# Patient Record
Sex: Male | Born: 1965 | Race: White | Hispanic: No | Marital: Single
Health system: Southern US, Community
[De-identification: ages and names within clinical notes are randomized; demographics above are authoritative.]

## PROBLEM LIST (undated history)

## (undated) DIAGNOSIS — M109 Gout, unspecified: Secondary | ICD-10-CM

---

## 2002-04-26 ENCOUNTER — Emergency Department (HOSPITAL_COMMUNITY): Admission: EM | Admit: 2002-04-26 | Discharge: 2002-04-26 | Payer: Self-pay | Admitting: Emergency Medicine

## 2003-10-31 ENCOUNTER — Emergency Department (HOSPITAL_COMMUNITY): Admission: EM | Admit: 2003-10-31 | Discharge: 2003-10-31 | Payer: Self-pay

## 2006-06-20 ENCOUNTER — Emergency Department (HOSPITAL_COMMUNITY): Admission: EM | Admit: 2006-06-20 | Discharge: 2006-06-20 | Payer: Self-pay | Admitting: Emergency Medicine

## 2008-03-18 ENCOUNTER — Emergency Department (HOSPITAL_COMMUNITY): Admission: EM | Admit: 2008-03-18 | Discharge: 2008-03-18 | Payer: Self-pay | Admitting: Emergency Medicine

## 2011-04-10 LAB — POCT CARDIAC MARKERS
CKMB, poc: 1.1
CKMB, poc: 1.3
CKMB, poc: 1.4
Myoglobin, poc: 66.3
Myoglobin, poc: 68.1
Myoglobin, poc: 72.5
Troponin i, poc: <0.05
Troponin i, poc: <0.05
Troponin i, poc: <0.05

## 2012-04-15 ENCOUNTER — Encounter (HOSPITAL_COMMUNITY): Payer: Self-pay | Admitting: Emergency Medicine

## 2012-04-15 ENCOUNTER — Emergency Department (HOSPITAL_COMMUNITY)
Admission: EM | Admit: 2012-04-15 | Discharge: 2012-04-15 | Disposition: A | Discharge status: Home / Self Care | Payer: Self-pay | Attending: Emergency Medicine | Admitting: Emergency Medicine

## 2012-04-15 DIAGNOSIS — M109 Gout, unspecified: Secondary | ICD-10-CM | POA: Insufficient documentation

## 2012-04-15 HISTORY — DX: Gout, unspecified: M10.9

## 2012-04-15 MED ORDER — PREDNISONE 10 MG PO TABS: 20.0000 mg | ORAL_TABLET | Freq: Every day | ORAL | Status: DC

## 2012-04-15 MED ORDER — OXYCODONE-ACETAMINOPHEN 5-325 MG PO TABS: 1.0000 | ORAL_TABLET | Freq: Four times a day (QID) | ORAL | Status: DC | PRN

## 2012-04-15 MED ORDER — ALLOPURINOL 100 MG PO TABS: 100.0000 mg | ORAL_TABLET | Freq: Every day | ORAL | Status: DC

## 2012-04-15 NOTE — ED Notes (Signed)
Pt c/o gout pain in left big toe/foot.  Pt given Indomethacin and Uloric 1 month ago which he states has not helped.

## 2012-04-15 NOTE — ED Provider Notes (Signed)
History     CSN: [id number]  Arrival date & time 04/15/12  1017   First MD Initiated Contact with Patient 04/15/12 1116      Chief Complaint  Patient presents with  . Foot Pain  . Gout    (Consider location/radiation/quality/duration/timing/severity/associated sxs/prior treatment) HPI  46 year old male with history of gout presenting complaining of left big toe pain. Patient reports he was diagnosed with gout 3 years ago. He has had a fairly benign course of symptoms until about a month ago when he has gradual onset of pain to his left great toe. Describe pain as a sharp and stabbing sensation, constant,  worsening with palpation or with movement. Does notice redness and swelling to the surrounding skin. Pain felt similar to previous gouty flare. He did follow up at urgent care several weeks ago and was prescribed uric and indomethacin. Patient states he has been taking medication with minimal relief. 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 Denies fever, chills, or rash. He has been avoiding red meat, and beer.    Past Medical History  Diagnosis Date  . Gout     History reviewed. No pertinent past surgical history.  History reviewed. No pertinent family history.  History  Substance Use Topics  . Smoking status: Never Smoker   . Smokeless tobacco: Not on file  . Alcohol Use: No      Review of Systems  Constitutional: Negative for fever.  Musculoskeletal: Positive for joint swelling.  Skin: Negative for rash and wound.    Allergies  Codeine  Home Medications   Current Outpatient Rx  Name Route Sig Dispense Refill  . FEBUXOSTAT 40 MG PO TABS Oral Take 80 mg by mouth daily.    . INDOMETHACIN 50 MG PO CAPS Oral Take 50 mg by mouth 3 (three) times daily with meals.      BP 145/90  Pulse 71  Temp 97.5 F (36.4 C) (Oral)  Resp 18  Ht 5\' 9"  (1.753 m)  Wt 183 lb (83.008 kg)   BMI 27.02 kg/m2  SpO2 99%  Physical Exam  Nursing note and vitals reviewed. Constitutional: He appears well-developed and well-nourished. No distress.  HENT:  Head: Atraumatic.  Eyes: Conjunctivae normal are normal.  Neck: Neck supple.  Musculoskeletal: He exhibits tenderness (L great toe with moderate tenderness, and swelling noted to MCP.  surrounding skin with warmth but without rash. ).       Pedal pulses intact  Neurological: He is alert.  Skin: Skin is warm. No rash noted.  Psychiatric: He has a normal mood and affect.    ED Course  Procedures (including critical care time)  Labs Reviewed - No data to display No results found.   No diagnosis found.  1. gout  MDM  Pain to L great toe x 1 month consistent with gouty flare.  Will d/c pt with percocet, prednisone taper course, and allopurinol.  Recommend f/u with pcp.  Doubt septic arthritis consider long duration.  Doubt trauma.  Pt voice understanding and agrees with plan.  Imaging considered, pt declined.  Pt unsure of allergy to codeine.  Agrees to take it.  Precaution discussed  BP 145/90  Pulse 71  Temp 97.5 F (36.4 C) (Oral)  Resp 18  Ht 5\' 9"  (1.753 m)  Wt 183 lb (83.008 kg)  BMI 27.02 kg/m2  SpO2 99%  Fayrene Helper, PA-C 04/15/12 1155

## 2012-04-16 NOTE — ED Provider Notes (Signed)
Medical screening examination/treatment/procedure(s) were performed by non-physician practitioner and as supervising physician I was immediately available for consultation/collaboration.   Loren Racer, MD 04/16/12 [phone]

## 2013-05-09 ENCOUNTER — Emergency Department (HOSPITAL_COMMUNITY): Payer: Self-pay

## 2013-05-09 ENCOUNTER — Emergency Department (HOSPITAL_COMMUNITY)
Admission: EM | Admit: 2013-05-09 | Discharge: 2013-05-09 | Disposition: A | Discharge status: Home / Self Care | Payer: Self-pay | Attending: Emergency Medicine | Admitting: Emergency Medicine

## 2013-05-09 ENCOUNTER — Encounter (HOSPITAL_COMMUNITY): Payer: Self-pay | Admitting: Emergency Medicine

## 2013-05-09 DIAGNOSIS — Z862 Personal history of diseases of the blood and blood-forming organs and certain disorders involving the immune mechanism: Secondary | ICD-10-CM | POA: Insufficient documentation

## 2013-05-09 DIAGNOSIS — J189 Pneumonia, unspecified organism: Secondary | ICD-10-CM | POA: Insufficient documentation

## 2013-05-09 DIAGNOSIS — Z8639 Personal history of other endocrine, nutritional and metabolic disease: Secondary | ICD-10-CM | POA: Insufficient documentation

## 2013-05-09 MED ORDER — AZITHROMYCIN 250 MG PO TABS: 250.0000 mg | ORAL_TABLET | Freq: Every day | ORAL | Status: DC

## 2013-05-09 MED ORDER — ACETAMINOPHEN-CODEINE 120-12 MG/5ML PO SUSP: 5.0000 mL | Freq: Four times a day (QID) | ORAL | Status: DC | PRN

## 2013-05-09 NOTE — ED Provider Notes (Signed)
CSN: [id number]     Arrival date & time 05/09/13  0915 History   First MD Initiated Contact with Patient 05/09/13 [phone]     Chief Complaint  Patient presents with  . Cough   (Consider location/radiation/quality/duration/timing/severity/associated sxs/prior Treatment) HPI Pt is a 47yo male with hx of gout c/o 1 week hx of persistent productive cough with yellow sputum x1 week.  Pt was seen at Urgent Care on Wednesday, 10/29 and dx with bronchitis. He was Rx: bactrim, benzonatate and promethazine for his persistent cough.  Pt states cough is worse when he wakes up in the morning, gradually improves throughout the day and then worse again at night preventing him from sleeping well. Has tried "everything" over the counter without relief. 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  Denies sick contacts at work or home. Denies fever, n/v/d. Denies hx of asthma or smoking.  Past Medical History  Diagnosis Date  . Gout    History reviewed. No pertinent past surgical history. No family history on file. History  Substance Use Topics  . Smoking status: Never Smoker   . Smokeless tobacco: Not on file  . Alcohol Use: No    Review of Systems  Constitutional: Positive for chills and fatigue. Negative for fever, appetite change and unexpected weight change.  HENT: Positive for congestion and sore throat. Negative for voice change.   Respiratory: Positive for cough. Negative for chest tightness, shortness of breath, wheezing and stridor.   Cardiovascular: Negative for chest pain.  Gastrointestinal: Negative for nausea, vomiting, abdominal pain and diarrhea.  All other systems reviewed and are negative.    Allergies  Codeine  Home Medications   Current Outpatient Rx  Name  Route  Sig  Dispense  Refill  . benzonatate (TESSALON) 100 MG capsule   Oral   Take 100 mg by mouth every 6 (six) hours as needed for cough.         . promethazine-dextromethorphan (PROMETHAZINE-DM) 6.25-15 MG/5ML syrup   Oral   Take 5 mLs by mouth every 6 (six) hours as needed for cough.         Marland Kitchen acetaminophen-codeine 120-12 MG/5ML suspension   Oral   Take 5 mLs by mouth every 6 (six) hours as needed for pain.   60 mL   0   . azithromycin (ZITHROMAX) 250 MG tablet   Oral   Take 1 tablet (250 mg total) by mouth daily. Take first 2 tablets together, then 1 every day until finished.   6 tablet   0    BP 120/83  Pulse 99  Temp(Src) 98.6 F (37 C) (Oral)  Resp 16  SpO2 96% Physical Exam  Nursing note and vitals reviewed. Constitutional: He appears well-developed and well-nourished.  HENT:  Head: Normocephalic and atraumatic.  Right Ear: Hearing, tympanic membrane, external ear and ear canal normal.  Left Ear: Hearing, tympanic membrane, external ear and ear canal normal.  Nose: Mucosal edema present.  Mouth/Throat: Uvula is midline and mucous membranes are normal. Posterior oropharyngeal erythema present. No oropharyngeal exudate, posterior oropharyngeal edema or tonsillar abscesses.  Eyes: Conjunctivae are normal. No scleral icterus.  Neck: Normal range of motion.  Cardiovascular: Normal rate, regular rhythm and normal heart sounds.   Pulmonary/Chest: Effort normal and breath sounds normal. No respiratory distress. He has no wheezes.  He has no rales. He exhibits no tenderness.  Intermittent mix dry and productive cough. No respiratory distress, able to speak in full sentences w/o difficulty. Lungs: CTAB  Abdominal: Soft. Bowel sounds are normal. He exhibits no distension and no mass. There is no tenderness. There is no rebound and no guarding.  Musculoskeletal: Normal range of motion.  Neurological: He is alert.  Skin: Skin is warm and dry.    ED Course  Procedures (including critical care time) Labs Review Labs Reviewed - No data to  display Imaging Review Dg Chest 2 View  05/09/2013   CLINICAL DATA:  Initial encounter for 1 week history of cough and chest congestion.  EXAM: CHEST  2 VIEW  COMPARISON:  06/20/2006.  FINDINGS: Streaky and patchy opacities in the right lower lobe, new since the prior examination. Lungs otherwise clear. No localized airspace consolidation. No pleural effusions. No pneumothorax. Normal pulmonary vascularity. Cardiomediastinal silhouette unremarkable and unchanged. Visualized bony thorax intact.  IMPRESSION: Mild atelectasis and/or bronchopneumonia in the right lower lobe.   Electronically Signed   By: Hulan Saas M.D.   On: 05/09/2013 11:09    EKG Interpretation   None       MDM   1. Right lower lobe pneumonia    Pt c/o persistent productive cough that has kept him up at night for the last several nights. Denies any relief from OTC medications, and medication rx from him by urgent care. Vitals: unremarkable. Pt appears non-toxic. No respiratory distress.  CXR: mild atelectasis and/or bronchopneumonia ir right lower lobe.  Will tx for pneumonia. Discussed discontinuing bactrim and starting pt on azithromycin.  Rx: azithromycin and acetaminophen-codeine syrup.  Discussed use of benadryl if pt develops itching with codeine. Advised not to drive, work, or operate heavy machinery while taking codeine.    All labs/imaging/findings discussed with patient. All questions answered and concerns addressed. Will discharge pt home and have pt f/u with [facility] info provided. Return precautions given. Pt verbalized understanding and agreement with tx plan. Vitals: unremarkable. Discharged in stable condition.    Discussed pt with attending during ED encounter and agrees with plan.     Junius Finner, PA-C 05/09/13 1133

## 2013-05-09 NOTE — ED Notes (Signed)
Pt reports having cough with productive, yellow sputum x1 week. Pt went to Urgent Care Wednesday and was dx with bronchitis. Pt was given abx, cough pills, syrup and has been taking them with no relief. Pt is A&O and in NAD.Pt c/o not being able to sleep.

## 2013-05-10 NOTE — ED Provider Notes (Signed)
Medical screening examination/treatment/procedure(s) were performed by non-physician practitioner and as supervising physician I was immediately available for consultation/collaboration.  EKG Interpretation   None         Monti Villers T Jarryn Altland, MD 05/10/13 0745 

## 2013-05-13 ENCOUNTER — Encounter (HOSPITAL_COMMUNITY): Payer: Self-pay | Admitting: Emergency Medicine

## 2013-05-13 ENCOUNTER — Emergency Department (HOSPITAL_COMMUNITY): Payer: Self-pay

## 2013-05-13 ENCOUNTER — Emergency Department (HOSPITAL_COMMUNITY)
Admission: EM | Admit: 2013-05-13 | Discharge: 2013-05-13 | Disposition: A | Discharge status: Home / Self Care | Payer: Self-pay | Attending: Emergency Medicine | Admitting: Emergency Medicine

## 2013-05-13 DIAGNOSIS — Z862 Personal history of diseases of the blood and blood-forming organs and certain disorders involving the immune mechanism: Secondary | ICD-10-CM | POA: Insufficient documentation

## 2013-05-13 DIAGNOSIS — R059 Cough, unspecified: Secondary | ICD-10-CM | POA: Insufficient documentation

## 2013-05-13 DIAGNOSIS — R05 Cough: Secondary | ICD-10-CM

## 2013-05-13 DIAGNOSIS — Z8639 Personal history of other endocrine, nutritional and metabolic disease: Secondary | ICD-10-CM | POA: Insufficient documentation

## 2013-05-13 DIAGNOSIS — Z8701 Personal history of pneumonia (recurrent): Secondary | ICD-10-CM | POA: Insufficient documentation

## 2013-05-13 MED ORDER — ACETAMINOPHEN-CODEINE 120-12 MG/5ML PO SUSP: 5.0000 mL | Freq: Four times a day (QID) | ORAL | Status: DC | PRN

## 2013-05-13 NOTE — ED Notes (Signed)
Patient complains of cough with yellow sputum. Patient went to urgent care last Thursday. Patient states Dx as "bronchitis" from urgent care. Patient came to [facility] ED Sunday morning and left with DX of pneumonia.

## 2013-05-13 NOTE — ED Provider Notes (Signed)
CSN: [id number]     Arrival date & time 05/13/13  1549 History   First MD Initiated Contact with Patient 05/13/13 1607     Chief Complaint  Patient presents with  . Cough   (Consider location/radiation/quality/duration/timing/severity/associated sxs/prior Treatment) HPI Comments: Patient presents to the emergency department with chief complaint of cough. States that he's been coughing for the past week or 2. Was recently diagnosed with pneumonia. He initially Z-Pak today. He states that he has been improving, but still has been coughing a lot. He states that he did not want to go to work today because of the severity of his cough. He is requesting a work note. He also wants to make certain the pneumonia is clearing up. He denies recent fever, or chills. Denies any other symptoms. States that he is tried taking Robitussin with codeine which has helped suppress the cough.  The history is provided by the patient. No language interpreter was used.    Past Medical History  Diagnosis Date  . Gout    No past surgical history on file. No family history on file. History  Substance Use Topics  . Smoking status: Never Smoker   . Smokeless tobacco: Not on file  . Alcohol Use: No    Review of Systems  All other systems reviewed and are negative.    Allergies  Codeine  Home Medications   Current Outpatient Rx  Name  Route  Sig  Dispense  Refill  . acetaminophen-codeine 120-12 MG/5ML suspension   Oral   Take 5 mLs by mouth every 6 (six) hours as needed for pain.   60 mL   0   . azithromycin (ZITHROMAX) 250 MG tablet   Oral   Take 1 tablet (250 mg total) by mouth daily. Take first 2 tablets together, then 1 every day until finished.   6 tablet   0   . benzonatate (TESSALON) 100 MG capsule   Oral   Take 100 mg by mouth every 6 (six) hours as needed for cough.         . promethazine-dextromethorphan (PROMETHAZINE-DM) 6.25-15 MG/5ML syrup   Oral   Take 5 mLs by mouth every 6  (six) hours as needed for cough.          BP 142/85  Pulse 119  Temp(Src) 98.4 F (36.9 C) (Oral)  Resp 18  SpO2 97% Physical Exam  Nursing note and vitals reviewed. Constitutional: He is oriented to person, place, and time. He appears well-developed and well-nourished.  HENT:  Head: Normocephalic and atraumatic.  Right Ear: External ear normal.  Left Ear: External ear normal.  Nose: Nose normal.  Mouth/Throat: Oropharynx is clear and moist. No oropharyngeal exudate.  Eyes: Conjunctivae and EOM are normal. Pupils are equal, round, and reactive to light. Right eye exhibits no discharge. Left eye exhibits no discharge. No scleral icterus.  Neck: Normal range of motion. Neck supple. No JVD present.  Cardiovascular: Normal rate, regular rhythm, normal heart sounds and intact distal pulses.  Exam reveals no gallop and no friction rub.   No murmur heard. Pulmonary/Chest: Effort normal and breath sounds normal. No respiratory distress. He has no wheezes. He has no rales. He exhibits no tenderness.  Abdominal: Soft. Bowel sounds are normal. He exhibits no distension and no mass. There is no tenderness. There is no rebound and no guarding.  Musculoskeletal: Normal range of motion. He exhibits no edema and no tenderness.  Neurological: He is alert and oriented to person, place,  and time.  Skin: Skin is warm and dry.  Psychiatric: He has a normal mood and affect. His behavior is normal. Judgment and thought content normal.    ED Course  Procedures (including critical care time) Results for orders placed during the hospital encounter of 03/18/08  POCT CARDIAC MARKERS      Result Value Range   Myoglobin, poc 72.5     CKMB, poc 1.3     Troponin i, poc <0.05    POCT CARDIAC MARKERS      Result Value Range   Myoglobin, poc 66.3     CKMB, poc 1.4     Troponin i, poc <0.05    POCT CARDIAC MARKERS      Result Value Range   Myoglobin, poc 68.1     CKMB, poc 1.1     Troponin i, poc <0.05      Dg Chest 2 View  05/13/2013   CLINICAL DATA:  Productive cough with shortness of breath and chest pain. Known pneumonia.  EXAM: CHEST  2 VIEW  COMPARISON:  Two-view chest 05/09/2013.  FINDINGS: The heart size and mediastinal contours are stable. Linear bibasilar pulmonary opacity is are similar. There is no confluent airspace opacity or pleural effusion. There is probably mild linear scarring in the left upper lobe. The osseous structures appear unchanged.  IMPRESSION: Stable linear basilar scarring or atelectasis. No evidence of pneumonia.   Electronically Signed   By: Roxy Horseman M.D.   On: 05/13/2013 16:39   Dg Chest 2 View  05/09/2013   CLINICAL DATA:  Initial encounter for 1 week history of cough and chest congestion.  EXAM: CHEST  2 VIEW  COMPARISON:  06/20/2006.  FINDINGS: Streaky and patchy opacities in the right lower lobe, new since the prior examination. Lungs otherwise clear. No localized airspace consolidation. No pleural effusions. No pneumothorax. Normal pulmonary vascularity. Cardiomediastinal silhouette unremarkable and unchanged. Visualized bony thorax intact.  IMPRESSION: Mild atelectasis and/or bronchopneumonia in the right lower lobe.   Electronically Signed   By: Hulan Saas M.D.   On: 05/09/2013 11:09      EKG Interpretation   None       MDM   1. Cough     Patient recovering from pneumonia. Will recheck chest x-ray, per patient's request. He states that he is improving. Patient work note for today.  Pt CXR negative for acute infiltrate. Patients symptoms are consistent with URI, likely viral etiology. Discussed that antibiotics are not indicated for viral infections. Pt will be discharged with symptomatic treatment.  Verbalizes understanding and is agreeable with plan. Pt is hemodynamically stable & in NAD prior to dc.  Filed Vitals:   05/13/13 1640  BP: 117/66  Pulse: 86  Temp: 98.6 F (37 C)  Resp: [address] [place] 05/13/13  2330

## 2013-05-15 NOTE — ED Provider Notes (Signed)
Medical screening examination/treatment/procedure(s) were performed by non-physician practitioner and as supervising physician I was immediately available for consultation/collaboration.  Flint Melter, MD 05/15/13 [phone]

## 2013-05-30 ENCOUNTER — Emergency Department (HOSPITAL_COMMUNITY)
Admission: EM | Admit: 2013-05-30 | Discharge: 2013-05-30 | Disposition: A | Discharge status: Home / Self Care | Payer: Self-pay | Attending: Emergency Medicine | Admitting: Emergency Medicine

## 2013-05-30 ENCOUNTER — Emergency Department (HOSPITAL_COMMUNITY): Payer: Self-pay

## 2013-05-30 ENCOUNTER — Encounter (HOSPITAL_COMMUNITY): Payer: Self-pay | Admitting: Emergency Medicine

## 2013-05-30 DIAGNOSIS — R000 Tachycardia, unspecified: Secondary | ICD-10-CM | POA: Insufficient documentation

## 2013-05-30 DIAGNOSIS — Z862 Personal history of diseases of the blood and blood-forming organs and certain disorders involving the immune mechanism: Secondary | ICD-10-CM | POA: Insufficient documentation

## 2013-05-30 DIAGNOSIS — Z8639 Personal history of other endocrine, nutritional and metabolic disease: Secondary | ICD-10-CM | POA: Insufficient documentation

## 2013-05-30 DIAGNOSIS — J9801 Acute bronchospasm: Secondary | ICD-10-CM | POA: Insufficient documentation

## 2013-05-30 DIAGNOSIS — R0789 Other chest pain: Secondary | ICD-10-CM

## 2013-05-30 DIAGNOSIS — R071 Chest pain on breathing: Secondary | ICD-10-CM | POA: Insufficient documentation

## 2013-05-30 DIAGNOSIS — Z8701 Personal history of pneumonia (recurrent): Secondary | ICD-10-CM | POA: Insufficient documentation

## 2013-05-30 MED ORDER — HYDROCODONE-ACETAMINOPHEN 5-325 MG PO TABS: 2.0000 | ORAL_TABLET | Freq: Three times a day (TID) | ORAL | Status: DC | PRN

## 2013-05-30 MED ORDER — ALBUTEROL SULFATE HFA 108 (90 BASE) MCG/ACT IN AERS: 2.0000 | INHALATION_SPRAY | RESPIRATORY_TRACT | Status: DC | PRN

## 2013-05-30 MED ORDER — PREDNISONE 20 MG PO TABS: ORAL_TABLET | ORAL | Status: DC

## 2013-05-30 NOTE — ED Notes (Signed)
Pt recently dx with pna and was rechecked after taking antibiotics and was cleared to go back to work. States that he has still been coughing and for 2 the last weeks when he coughs he has severe pain on his L lower side.

## 2013-05-30 NOTE — ED Provider Notes (Signed)
CSN: [id number]     Arrival date & time 05/30/13  1526 History   First MD Initiated Contact with Patient 05/30/13 1546     Chief Complaint  Patient presents with  . Chest Wall Pain    (Consider location/radiation/quality/duration/timing/severity/associated sxs/prior Treatment) HPI This 47 year old coughing for a month, he was diagnosed with pneumonia in the right lower lobe, he is taking 2 rounds of antibiotics, his cough is much less frequent but when he gets coughing spells they're still severe spells come is not short of breath, during his severe coughing spells last few days he started developing sharp stabbing left-sided positional and pleuritic chest pain which is constant 24 hours a last few days exacerbated by coughing and palpation of his torso twisting of his torso with no abdominal pain no syncope no vomiting no bloody stools no bloody urine and no trauma but he does have severe coughing spells for several seconds at a time he will feel lightheaded with presyncope. There is no treatment prior to arrival other than his recent antibiotics. Past Medical History  Diagnosis Date  . Gout    History reviewed. No pertinent past surgical history. History reviewed. No pertinent family history. History  Substance Use Topics  . Smoking status: Never Smoker   . Smokeless tobacco: Not on file  . Alcohol Use: No    Review of Systems 10 Systems reviewed and are negative for acute change except as noted in the HPI. Allergies  Codeine  Home Medications   Current Outpatient Rx  Name  Route  Sig  Dispense  Refill  . albuterol (PROVENTIL HFA;VENTOLIN HFA) 108 (90 BASE) MCG/ACT inhaler   Inhalation   Inhale 2 puffs into the lungs every 2 (two) hours as needed for wheezing or shortness of breath (cough).   1 Inhaler   0   . HYDROcodone-acetaminophen (NORCO) 5-325 MG per tablet   Oral   Take 2 tablets by mouth every 8 (eight) hours as needed.   15 tablet   0   . predniSONE (DELTASONE)  20 MG tablet      3 tabs po day one, then 2 po daily x 4 days   11 tablet   0    BP 131/94  Pulse 83  Temp(Src) 98.6 F (37 C) (Oral)  Resp 20  SpO2 100% Physical Exam  Nursing note and vitals reviewed. Constitutional:  Awake, alert, nontoxic appearance.  HENT:  Head: Atraumatic.  Eyes: Right eye exhibits no discharge. Left eye exhibits no discharge.  Neck: Neck supple.  Cardiovascular: Regular rhythm.   No murmur heard. Tachycardic  Pulmonary/Chest: Effort normal. No respiratory distress. He has wheezes. He has no rales. He exhibits tenderness.  Exactly reproducible left anterolateral lower chest wall tenderness with no rash no crepitus no deformity noted; mild scattered expiratory wheezes with forced exhalation with somewhat prolonged exhalation with nonlabored breathing with no crackles no rhonchi no retractions no excess or muscle usage, speech is normal, pulse oximetry normal on room air at 96%  Abdominal: Soft. There is no tenderness. There is no rebound.  Musculoskeletal: He exhibits no edema and no tenderness.  Baseline ROM, no obvious new focal weakness.  Neurological: He is alert.  Mental status and motor strength appears baseline for patient and situation.  Skin: No rash noted.  Psychiatric: He has a normal mood and affect.    ED Course  Procedures (including critical care time) Labs Review Labs Reviewed - No data to display Imaging Review No results found.  EKG Interpretation    Date/Time:  Sunday May 30 2013 15:49:09 EST Ventricular Rate:  117 PR Interval:  123 QRS Duration: 83 QT Interval:  317 QTC Calculation: 442 R Axis:   73 Text Interpretation:  Sinus tachycardia with irregular rate Compared to previous tracing Rate faster Confirmed by [facility]  MD, Gwenyth Dingee ([id number]) on 05/30/2013 4:09:24 PM            MDM   1. Chest wall pain   2. Bronchospasm    I doubt any other EMC precluding discharge at this time including, but not necessarily  limited to the following:PNA.    Hurman Horn, MD 06/02/13 [phone]

## 2013-11-05 ENCOUNTER — Encounter (HOSPITAL_COMMUNITY): Payer: Self-pay | Admitting: Emergency Medicine

## 2013-11-05 ENCOUNTER — Emergency Department (HOSPITAL_COMMUNITY)
Admission: EM | Admit: 2013-11-05 | Discharge: 2013-11-05 | Disposition: A | Discharge status: Home / Self Care | Payer: Self-pay | Attending: Emergency Medicine | Admitting: Emergency Medicine

## 2013-11-05 ENCOUNTER — Emergency Department (HOSPITAL_COMMUNITY): Payer: Self-pay

## 2013-11-05 DIAGNOSIS — R05 Cough: Secondary | ICD-10-CM

## 2013-11-05 DIAGNOSIS — M109 Gout, unspecified: Secondary | ICD-10-CM | POA: Insufficient documentation

## 2013-11-05 DIAGNOSIS — J3489 Other specified disorders of nose and nasal sinuses: Secondary | ICD-10-CM | POA: Insufficient documentation

## 2013-11-05 DIAGNOSIS — R059 Cough, unspecified: Secondary | ICD-10-CM | POA: Insufficient documentation

## 2013-11-05 DIAGNOSIS — IMO0002 Reserved for concepts with insufficient information to code with codable children: Secondary | ICD-10-CM | POA: Insufficient documentation

## 2013-11-05 DIAGNOSIS — Z79899 Other long term (current) drug therapy: Secondary | ICD-10-CM | POA: Insufficient documentation

## 2013-11-05 MED ORDER — ALBUTEROL SULFATE HFA 108 (90 BASE) MCG/ACT IN AERS: 1.0000 | INHALATION_SPRAY | Freq: Four times a day (QID) | RESPIRATORY_TRACT | Status: AC | PRN

## 2013-11-05 MED ORDER — BENZONATATE 100 MG PO CAPS
100.0000 mg | ORAL_CAPSULE | Freq: Three times a day (TID) | ORAL | Status: AC
Start: 2013-11-05 — End: ?

## 2013-11-05 NOTE — ED Provider Notes (Signed)
CSN: [id number]     Arrival date & time 11/05/13  0804 History   First MD Initiated Contact with Patient 11/05/13 [phone]     Chief Complaint  Patient presents with  . Cough     (Consider location/radiation/quality/duration/timing/severity/associated sxs/prior Treatment) HPI Comments: Patient reports coughing for the past 6 months is productive of clear white mucus. He states he came in today because he is having trouble sleeping because of his cough. Denies any fever. Denies any chest pain. He was treated for pneumonia last November he states he never got better. He denies smoking. 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 Denies any leg pain or leg swelling. No recent travel. No night sweats or weight loss. Good by mouth intake and urine output.  The history is provided by the patient.    Past Medical History  Diagnosis Date  . Gout    History reviewed. No pertinent past surgical history. History reviewed. No pertinent family history. History  Substance Use Topics  . Smoking status: Passive Smoke Exposure - Never Smoker  . Smokeless tobacco: Not on file  . Alcohol Use: No    Review of Systems  Constitutional: Negative for fever, activity change and appetite change.  HENT: Positive for congestion.   Respiratory: Positive for cough. Negative for chest tightness and shortness of breath.   Cardiovascular: Negative for chest pain.  Gastrointestinal: Negative for nausea, vomiting and abdominal pain.  Genitourinary: Negative for dysuria, urgency and hematuria.  Musculoskeletal: Negative for arthralgias and myalgias.  Skin: Negative for rash.  Neurological: Negative for dizziness, weakness and headaches.  A complete 10 system review of systems was obtained and all systems are negative except as noted in the HPI and PMH.      Allergies  Codeine  Home Medications   Prior to Admission medications   Medication Sig Start Date End Date  Taking? Authorizing Provider  albuterol (PROVENTIL HFA;VENTOLIN HFA) 108 (90 BASE) MCG/ACT inhaler Inhale 2 puffs into the lungs every 2 (two) hours as needed for wheezing or shortness of breath (cough). 05/30/13   Hurman HornJohn M Bednar, MD  HYDROcodone-acetaminophen (NORCO) 5-325 MG per tablet Take 2 tablets by mouth every 8 (eight) hours as needed. 05/30/13   Hurman HornJohn M Bednar, MD  predniSONE (DELTASONE) 20 MG tablet 3 tabs po day one, then 2 po daily x 4 days 05/30/13   Hurman HornJohn M Bednar, MD   BP 136/92  Pulse 87  Temp(Src) 97.7 F (36.5 C) (Oral)  Resp 16  SpO2 100% Physical Exam  Constitutional: He is oriented to person, place, and time. He appears well-developed and well-nourished. No distress.  Frequent dry cough  HENT:  Head: Normocephalic and atraumatic.  Mouth/Throat: Oropharynx is clear and moist. No oropharyngeal exudate.  Eyes: Conjunctivae and EOM are normal. Pupils are equal, round, and reactive to light.  Neck: Normal range of motion. Neck supple.  Cardiovascular: Normal rate, regular rhythm and normal heart sounds.   No murmur heard. Pulmonary/Chest: Effort normal and breath sounds normal. No respiratory distress.  Abdominal: Soft. There is no tenderness. There is no rebound and no guarding.  Musculoskeletal: Normal range of motion. He exhibits no edema and no tenderness.  Neurological: He is alert and oriented to person, place, and time. No cranial nerve deficit. He exhibits normal muscle tone. Coordination normal.  Skin: Skin is warm.    ED Course  Procedures (including critical care time) Labs Review Labs Reviewed - No  data to display  Imaging Review Dg Chest 2 View  11/05/2013   CLINICAL DATA:  Cough.  EXAM: CHEST  2 VIEW  COMPARISON:  May 30, 2013.  FINDINGS: The heart size and mediastinal contours are within normal limits. No pneumothorax or pleural effusion is noted. Stable scarring is seen laterally in the left lung apex. No acute pulmonary disease is noted. The  visualized skeletal structures are unremarkable.  IMPRESSION: No acute cardiopulmonary abnormality seen.   Electronically Signed   By: Roque LiasJames  Green M.D.   On: 11/05/2013 08:51     EKG Interpretation   Date/Time:  Friday Nov 05 2013 08:30:29 EDT Ventricular Rate:  83 PR Interval:  140 QRS Duration: 80 QT Interval:  377 QTC Calculation: 443 R Axis:   48 Text Interpretation:  Sinus rhythm No significant change was found  Confirmed by Manus GunningANCOUR  MD, Dayshawn Irizarry ([id number]) on 11/05/2013 8:35:32 AM      MDM   Final diagnoses:  Cough   Cough for 6 months with smoke exposure and dust exposure. No chest pain. No tachycardia or hypoxia. No travel, fever, recent weight loss or night sweats.  Chest x-ray negative. EKG normal sinus rhythm. No hypoxia or tachycardia. Informed patient of negative findings. Relate that he needs to avoid smoke dust exposure. 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 No indication for antibiotics.     Glynn OctaveStephen Chassie Pennix, MD 11/05/13 1010

## 2013-11-05 NOTE — Discharge Instructions (Signed)

## 2013-11-05 NOTE — ED Notes (Signed)
Pt reports coughing x 6 months with white, thick mucous. Denies fever, sts never smoked but exposed to second hand smoke

## 2013-11-05 NOTE — Progress Notes (Signed)
[facility] provided pt with a list of primary care resources and a [facility] [employer] Card application to help patient establish primary care.  °

## 2018-03-16 ENCOUNTER — Encounter (HOSPITAL_COMMUNITY): Payer: Self-pay

## 2018-03-16 ENCOUNTER — Emergency Department (HOSPITAL_COMMUNITY): Payer: Self-pay

## 2018-03-16 ENCOUNTER — Other Ambulatory Visit: Payer: Self-pay

## 2018-03-16 ENCOUNTER — Emergency Department (HOSPITAL_COMMUNITY)
Admission: EM | Admit: 2018-03-16 | Discharge: 2018-03-16 | Disposition: A | Discharge status: Home / Self Care | Payer: Self-pay | Attending: Emergency Medicine | Admitting: Emergency Medicine

## 2018-03-16 DIAGNOSIS — K5792 Diverticulitis of intestine, part unspecified, without perforation or abscess without bleeding: Secondary | ICD-10-CM | POA: Insufficient documentation

## 2018-03-16 LAB — COMPREHENSIVE METABOLIC PANEL
ALBUMIN: 4.2 g/dL (ref 3.5–5.0)
ALK PHOS: 86 U/L (ref 38–126)
ALT: 38 U/L (ref 0–44)
ANION GAP: 11 (ref 5–15)
AST: 25 U/L (ref 15–41)
BILIRUBIN TOTAL: 0.3 mg/dL (ref 0.3–1.2)
BUN: 13 mg/dL (ref 6–20)
CALCIUM: 9.6 mg/dL (ref 8.9–10.3)
CO2: 23 mmol/L (ref 22–32)
CREATININE: 0.96 mg/dL (ref 0.61–1.24)
Chloride: 104 mmol/L (ref 98–111)
GFR calc Af Amer: >60 mL/min (ref >60)
GFR calc non Af Amer: >60 mL/min (ref >60)
GLUCOSE: 100 mg/dL — AB (ref 70–99)
Potassium: 3.9 mmol/L (ref 3.5–5.1)
SODIUM: 138 mmol/L (ref 135–145)
Total Protein: 7.3 g/dL (ref 6.5–8.1)

## 2018-03-16 LAB — URINALYSIS, ROUTINE W REFLEX MICROSCOPIC
BILIRUBIN URINE: NEGATIVE
Glucose, UA: NEGATIVE mg/dL
HGB URINE DIPSTICK: NEGATIVE
KETONES UR: NEGATIVE mg/dL
Leukocytes, UA: NEGATIVE
Nitrite: NEGATIVE
Protein, ur: NEGATIVE mg/dL
pH: 5 (ref 5.0–8.0)

## 2018-03-16 LAB — CBC
HCT: 48.9 % (ref 39.0–52.0)
HEMOGLOBIN: 17.2 g/dL — AB (ref 13.0–17.0)
MCH: 31.5 pg (ref 26.0–34.0)
MCHC: 35.2 g/dL (ref 30.0–36.0)
MCV: 89.6 fL (ref 78.0–100.0)
PLATELETS: 256 10*3/uL (ref 150–400)
RBC: 5.46 MIL/uL (ref 4.22–5.81)
RDW: 12.9 % (ref 11.5–15.5)
WBC: 6.3 10*3/uL (ref 4.0–10.5)

## 2018-03-16 LAB — LIPASE, BLOOD: Lipase: 27 U/L (ref 11–51)

## 2018-03-16 MED ORDER — METRONIDAZOLE 500 MG PO TABS: 500.0000 mg | ORAL_TABLET | Freq: Two times a day (BID) | ORAL | 0 refills | Status: AC

## 2018-03-16 MED ORDER — CIPROFLOXACIN HCL 500 MG PO TABS: 500.0000 mg | ORAL_TABLET | Freq: Two times a day (BID) | ORAL | 0 refills | Status: AC

## 2018-03-16 MED ORDER — SODIUM CHLORIDE 0.9 % IJ SOLN
INTRAMUSCULAR | Status: AC
  Filled 2018-03-16: qty 50

## 2018-03-16 MED ORDER — IOPAMIDOL (ISOVUE-300) INJECTION 61%
INTRAVENOUS | Status: AC
  Filled 2018-03-16: qty 100

## 2018-03-16 MED ORDER — IOPAMIDOL (ISOVUE-300) INJECTION 61%
100.0000 mL | Freq: Once | INTRAVENOUS | Status: AC | PRN
  Administered 2018-03-16: 100 mL via INTRAVENOUS

## 2018-03-16 MED ORDER — SODIUM CHLORIDE 0.9 % IV SOLN
INTRAVENOUS | Status: DC
  Administered 2018-03-16: 1000 mL via INTRAVENOUS

## 2018-03-16 MED ORDER — CIPROFLOXACIN HCL 500 MG PO TABS
500.0000 mg | ORAL_TABLET | Freq: Once | ORAL | Status: AC
  Administered 2018-03-16: 500 mg via ORAL
  Filled 2018-03-16: qty 1

## 2018-03-16 MED ORDER — METRONIDAZOLE 500 MG PO TABS
500.0000 mg | ORAL_TABLET | Freq: Once | ORAL | Status: AC
  Administered 2018-03-16: 500 mg via ORAL
  Filled 2018-03-16: qty 1

## 2018-03-16 MED ORDER — CIPROFLOXACIN HCL 500 MG PO TABS: 500.0000 mg | ORAL_TABLET | Freq: Two times a day (BID) | ORAL | 0 refills | Status: DC

## 2018-03-16 MED ORDER — METRONIDAZOLE 500 MG PO TABS: 500.0000 mg | ORAL_TABLET | Freq: Two times a day (BID) | ORAL | 0 refills | Status: DC

## 2018-03-16 NOTE — ED Provider Notes (Signed)
[facility]-EMERGENCY DEPT Provider Note   CSN: [id number] Arrival date & time: 03/16/18  7829     History   Chief Complaint Chief Complaint  Patient presents with  . Abdominal Pain    HPI Justin Mathis is a 52 y.o. male.  52 year old male presents with intermittent left lower quadrant abdominal pain x24 hours.  No fever, nausea, vomiting or diarrhea.  No dysuria or hematuria.  Pain did not begin in his flank.  Denies any testicular pain or swelling.  Pain comes in waves and is colicky.  Nothing seems to make it better or worse no treatment used for this prior to arrival.  Denies any prior history of similar symptoms.     Past Medical History:  Diagnosis Date  . Gout     There are no active problems to display for this patient.   History reviewed. No pertinent surgical history.      Home Medications    Prior to Admission medications   Medication Sig Start Date End Date Taking? Authorizing Provider  albuterol (PROVENTIL HFA;VENTOLIN HFA) 108 (90 BASE) MCG/ACT inhaler Inhale 1-2 puffs into the lungs every 6 (six) hours as needed for wheezing or shortness of breath. 11/05/13   Rancour, Jeannett Senior, MD  benzonatate (TESSALON) 100 MG capsule Take 1 capsule (100 mg total) by mouth every 8 (eight) hours. 11/05/13   Rancour, Jeannett Senior, MD  ibuprofen (ADVIL,MOTRIN) 200 MG tablet Take 200 mg by mouth every 6 (six) hours as needed for mild pain.    [provider]    Family History History reviewed. No pertinent family history.  Social History Social History   Tobacco Use  . Smoking status: Passive Smoke Exposure - Never Smoker  . Smokeless tobacco: Never Used  Substance Use Topics  . Alcohol use: Not Currently  . Drug use: Never     Allergies   Codeine   Review of Systems Review of Systems  All other systems reviewed and are negative.    Physical Exam Updated Vital Signs BP (!) 159/99 (BP Location: Left Arm)   Pulse 82   Temp 98.7 F  (37.1 C) (Oral)   Resp 16   Ht 1.753 m (5\' 9" )   Wt 90.7 kg   SpO2 98%   BMI 29.53 kg/m   Physical Exam  Constitutional: He is oriented to person, place, and time. He appears well-developed and well-nourished.  Non-toxic appearance. No distress.  HENT:  Head: Normocephalic and atraumatic.  Eyes: Pupils are equal, round, and reactive to light. Conjunctivae, EOM and lids are normal.  Neck: Normal range of motion. Neck supple. No tracheal deviation present. No thyroid mass present.  Cardiovascular: Normal rate, regular rhythm and normal heart sounds. Exam reveals no gallop.  No murmur heard. Pulmonary/Chest: Effort normal and breath sounds normal. No stridor. No respiratory distress. He has no decreased breath sounds. He has no wheezes. He has no rhonchi. He has no rales.  Abdominal: Soft. Normal appearance and bowel sounds are normal. He exhibits no distension. There is tenderness in the left lower quadrant. There is guarding. There is no rigidity, no rebound and no CVA tenderness.    Musculoskeletal: Normal range of motion. He exhibits no edema or tenderness.  Neurological: He is alert and oriented to person, place, and time. He has normal strength. No cranial nerve deficit or sensory deficit. GCS eye subscore is 4. GCS verbal subscore is 5. GCS motor subscore is 6.  Skin: Skin is warm and dry. No  abrasion and no rash noted.  Psychiatric: He has a normal mood and affect. His speech is normal and behavior is normal.  Nursing note and vitals reviewed.    ED Treatments / Results  Labs (all labs ordered are listed, but only abnormal results are displayed) Labs Reviewed  COMPREHENSIVE METABOLIC PANEL - Abnormal; Notable for the following components:      Result Value   Glucose, Bld 100 (*)    All other components within normal limits  CBC - Abnormal; Notable for the following components:   Hemoglobin 17.2 (*)    All other components within normal limits  LIPASE, BLOOD  URINALYSIS,  ROUTINE W REFLEX MICROSCOPIC    EKG None  Radiology No results found.  Procedures Procedures (including critical care time)  Medications Ordered in ED Medications  0.9 %  sodium chloride infusion (has no administration in time range)     Initial Impression / Assessment and Plan / ED Course  I have reviewed the triage vital signs and the nursing notes.  Pertinent labs & imaging results that were available during my care of the patient were reviewed by me and considered in my medical decision making (see chart for details).     CT scan of abdomen consistent with diverticulitis.  Patient given oral dose of Cipro and Flagyl here and will be placed on same and encouraged to follow-up with his doctor.  Final Clinical Impressions(s) / ED Diagnoses   Final diagnoses:  None    ED Discharge Orders    None       Lorre Nick, MD 03/16/18 2228

## 2018-03-16 NOTE — ED Triage Notes (Signed)
Patient c/o intermittent left lower abdominal pain that started last night. Patient denies any N/v/d.

## 2019-02-22 IMAGING — CT CT ABD-PELV W/ CM
2 of 5 series · 16 of 46 positions shown, 18 images · IV contrast (ISOVUE)
Comparison: None

CLINICAL DATA: Intermittent LEFT lower abdominal pain since last
night

EXAM:
CT ABDOMEN AND PELVIS WITH CONTRAST
TECHNIQUE: Multidetector CT imaging of the abdomen and pelvis was performed
using the standard protocol following bolus administration of
intravenous contrast. Sagittal and coronal MPR images reconstructed
from axial data set.
CONTRAST:  100mL VKQUPL-8BB IOPAMIDOL (VKQUPL-8BB) INJECTION 61% IV.
No oral contrast administered.

[Series 2: axial st · axial · 0.78mm/px · z∈[-498,-73]mm · 13 of 101 slices shown, 15 images]
[im 8/101  soft-tissue]
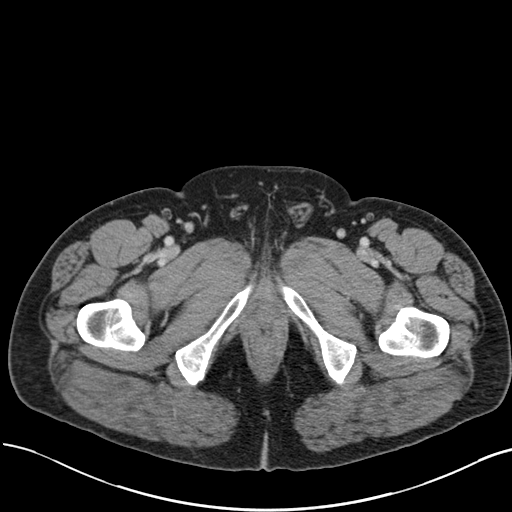
[im 8/101  bone]
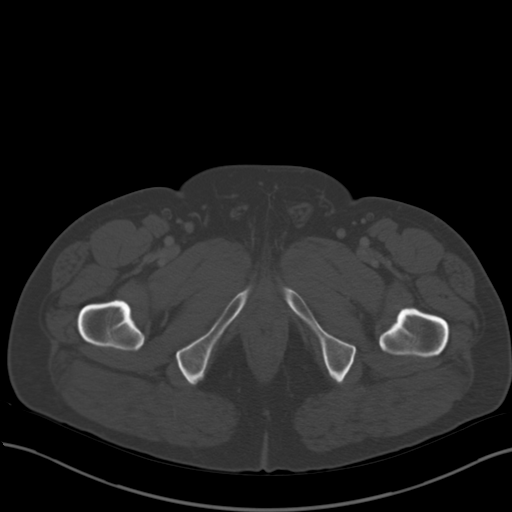
[im 15/101  soft-tissue]
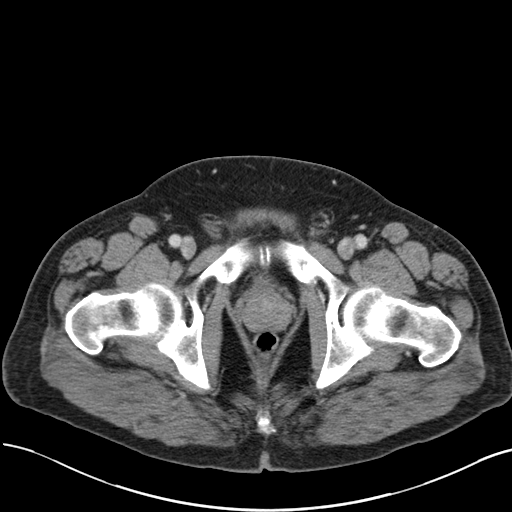
[im 22/101  soft-tissue]
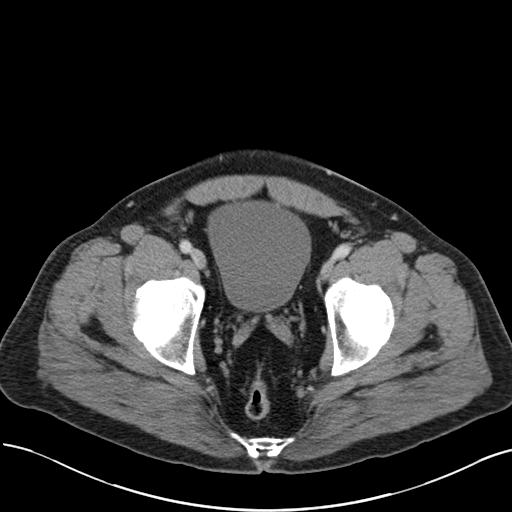
[im 29/101  soft-tissue]
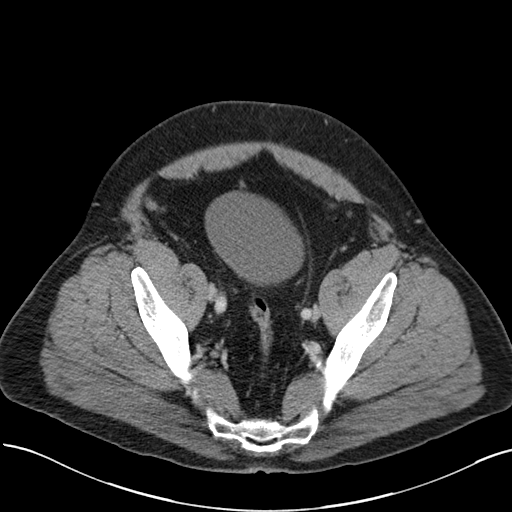
[im 36/101  soft-tissue]
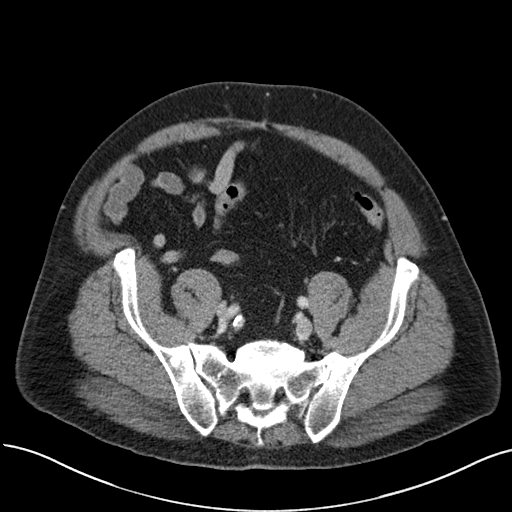
[im 43/101  soft-tissue]
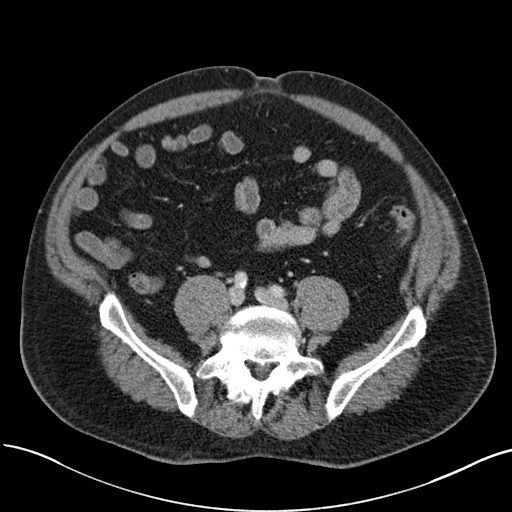
[im 51/101  soft-tissue]
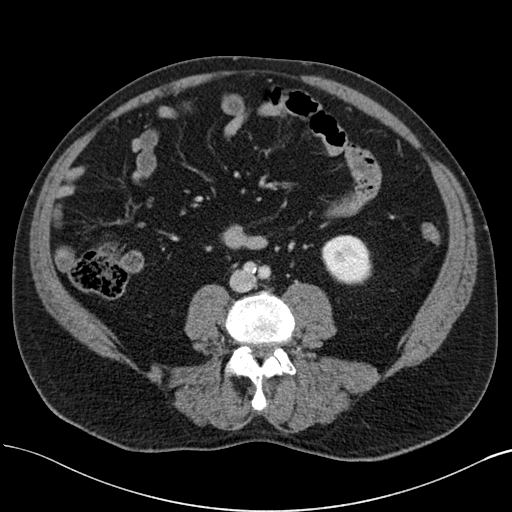
[im 58/101  soft-tissue]
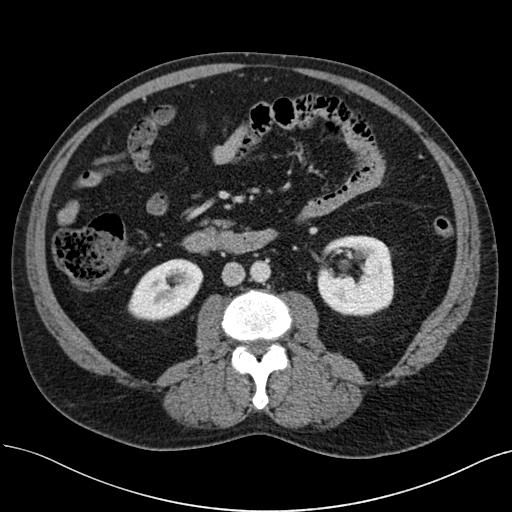
[im 65/101  soft-tissue]
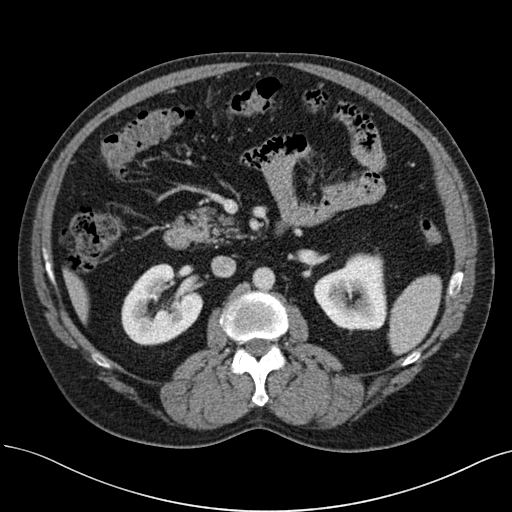
[im 65/101  bone]
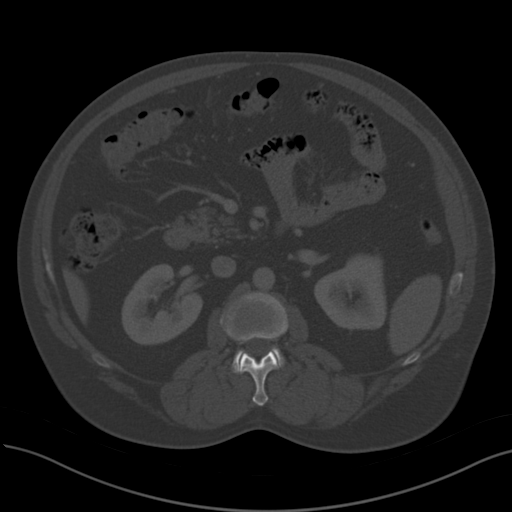
[im 72/101  soft-tissue]
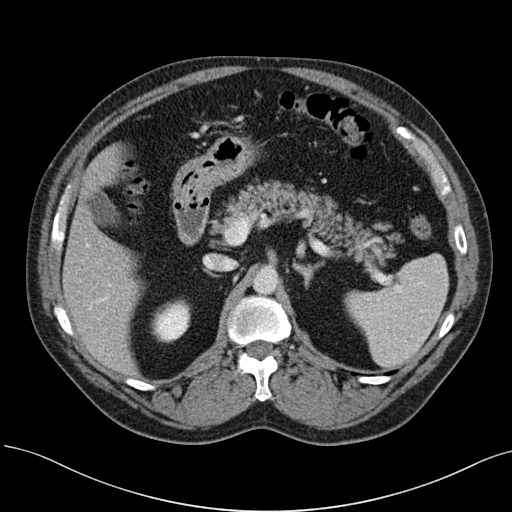
[im 79/101  soft-tissue]
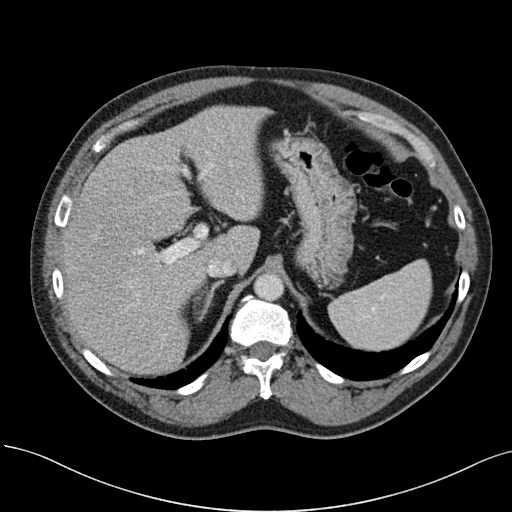
[im 86/101  soft-tissue]
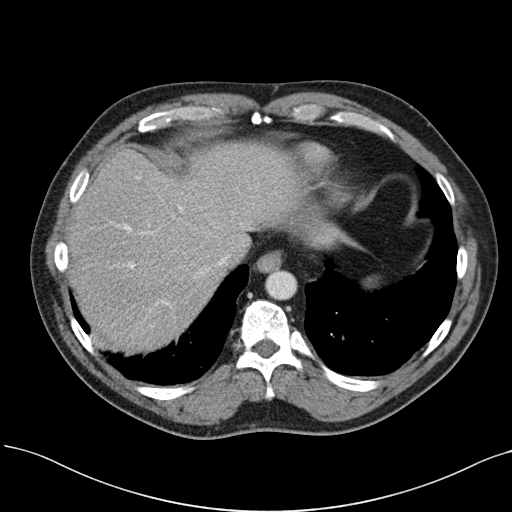
[im 93/101  soft-tissue]
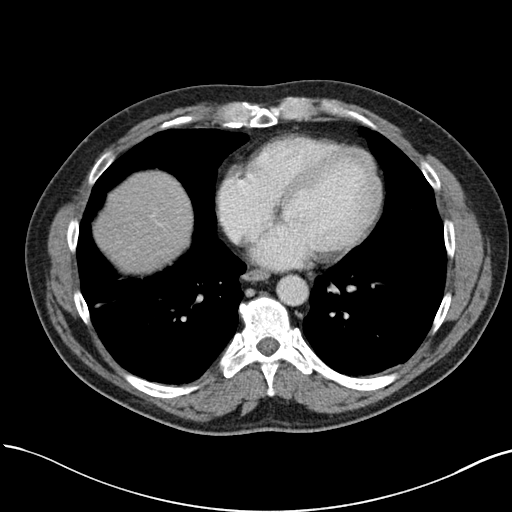

[Series 5: coronal st · coronal · 0.79mm/px · 3 of 110 slices shown]
[im 37/110  soft-tissue]
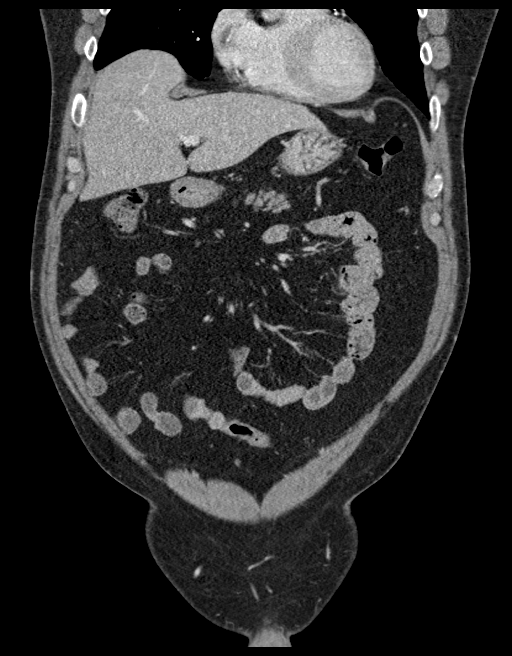
[im 49/110  soft-tissue]
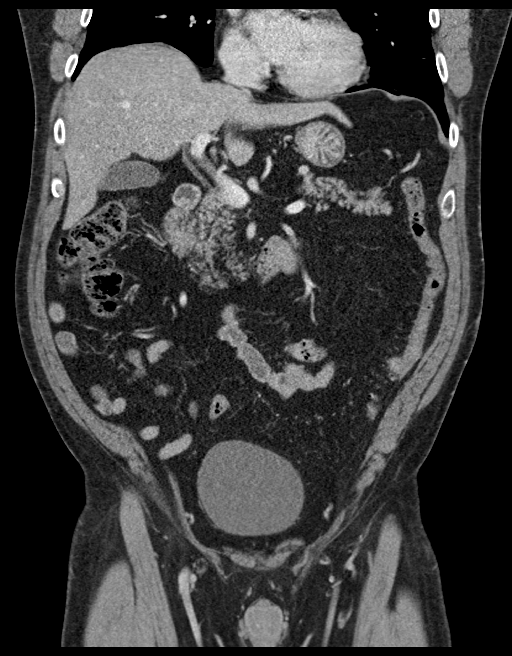
[im 61/110  soft-tissue]
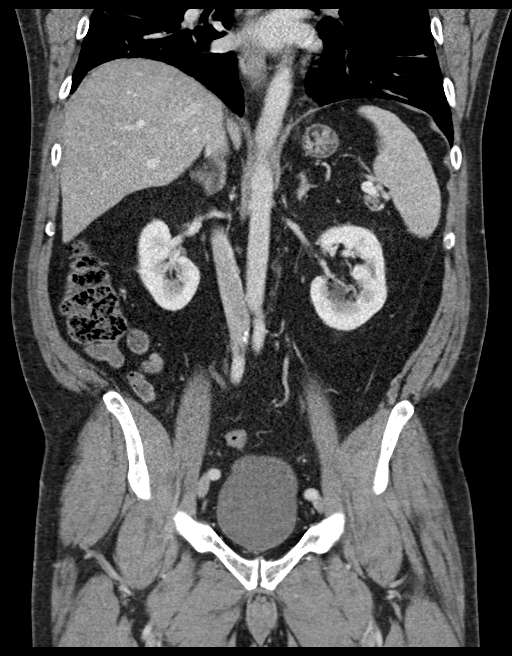

[16 of 46 positions shown; findings below may reference images not displayed]

FINDINGS: Lower chest: Subsegmental atelectasis at both lung bases

Hepatobiliary: Gallbladder and liver normal appearance

Pancreas: Normal appearance

Spleen: Normal appearance.  Two small splenules.

Adrenals/Urinary Tract: Small peripelvic cysts LEFT kidney. Tiny
nonobstructing LEFT renal calculus image 41. Adrenal glands,
kidneys, ureters, and bladder otherwise normal appearance.

Stomach/Bowel: Stomach decompressed., unremarkable. Normal appendix.
Minimal diverticulosis of descending and sigmoid colon. Small focus
of subtle pericolic infiltrative changes in the LEFT mid abdomen
consistent with subtle acute diverticulitis. No evidence of
perforation or abscess. Remaining bowel loops unremarkable.

Vascular/Lymphatic: Mild atherosclerotic calcifications of aorta and
iliac arteries. Aorta normal caliber. No adenopathy.

Reproductive: Normal appearance

Other: Small umbilical hernia containing fat. No free air or free
fluid.

Musculoskeletal: Osseous mineralization normal. Sclerosis within the
LEFT femoral head consistent with prior avascular necrosis, with
femoral head morphology and joint space maintained.
IMPRESSION: Subtle acute diverticulitis of the descending colon.

Mild diverticulosis of descending and sigmoid colon.

Small peripelvic cysts and tiny nonobstructing calculus LEFT kidney.

Prior AVN of the LEFT femoral head.

## 2019-08-09 DEATH — deceased
